# Patient Record
Sex: Female | Born: 2000 | Race: White | Hispanic: No | Marital: Single | Smoking: Never smoker
Health system: Southern US, Community
[De-identification: ages and names within clinical notes are randomized; demographics above are authoritative.]

---
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
## 2021-02-25 NOTE — Progress Notes (Addendum)
Electrophysiology Office Note:    Date:  02/26/2021   ID:  Holly Lambert, DOB Oct 02, 2000, MRN [id number]  PCP:  Patient, No Pcp Per (Inactive)  [facility] Cardiologist:  None  [facility] Electrophysiologist:  Lanier Prude, MD   Referring MD: No ref. provider found   Chief Complaint: Family history of cardiomyopathy  History of Present Illness:    Holly Lambert is a 20 y.o. female who presents for an evaluation of family history of cardiomyopathy as a self-referral.  I see her mother, Holly Lambert.  Her mother has a mutation in the DSP gene which is a known pathogenic gene mutation.  Her mother has a nonischemic cardiomyopathy associated with this gene mutation.   Her only cardiac related complaint is a history of vasovagal sounding near syncope.  She tells me that she can have spells of lightheadedness that usually occur with changes to her vision.  She says the peripheral vision goes dark and she gets tunnel vision and then gets lightheaded.  Sometimes she also developed headache during these episodes.  She is fallen out once before but did not lose consciousness.  No other syncopal episodes.  She does not report any known triggers such as blood draws, etc.     History reviewed. No pertinent past medical history.  History reviewed. No pertinent surgical history.  Current Medications: Current Meds  Medication Sig   albuterol (PROVENTIL HFA;VENTOLIN HFA) 108 (90 BASE) MCG/ACT inhaler Inhale 1 puff into the lungs every 6 (six) hours as needed for wheezing or shortness of breath.   AZSTARYS 39.2-7.8 MG CAPS 1 capsule  once a day   busPIRone (BUSPAR) 15 MG tablet Take by mouth.   escitalopram (LEXAPRO) 10 MG tablet Take 10 mg by mouth daily.   LATUDA 40 MG TABS tablet SMARTSIG:1 Tablet(s) By Mouth Every Evening   methylphenidate (RITALIN) 10 MG tablet Take 10 mg by mouth as needed.     Allergies:   Patient has no known allergies.   Social History    Socioeconomic History   Marital status: Single    Spouse name: Not on file   Number of children: Not on file   Years of education: Not on file   Highest education level: Not on file  Occupational History   Not on file  Tobacco Use   Smoking status: Never   Smokeless tobacco: Never  Substance and Sexual Activity   Alcohol use: Not on file   Drug use: Not on file   Sexual activity: Not on file  Other Topics Concern   Not on file  Social History Narrative   Not on file   Social Determinants of Health   Financial Resource Strain: Not on file  Food Insecurity: Not on file  Transportation Needs: Not on file  Physical Activity: Not on file  Stress: Not on file  Social Connections: Not on file     Family History: The patient's family history is not on file.  ROS:   Please see the history of present illness.    All other systems reviewed and are negative.  EKGs/Labs/Other Studies Reviewed:    The following studies were reviewed today:  Outside hospital echo that showed normal left ventricular function and mild MR.  EKG:  The ekg ordered today demonstrates sinus arrhythmia   Recent Labs: No results found for requested labs within last 8760 hours.  Recent Lipid Panel No results found for: CHOL, TRIG, HDL, CHOLHDL, VLDL, LDLCALC, LDLDIRECT  Physical Exam:  VS:  BP 126/78 (BP Location: Left Arm, Patient Position: Sitting, Cuff Size: Normal)   Pulse 65   Ht 5' 7.5" (1.715 m)   Wt 168 lb (76.2 kg)   SpO2 98%   BMI 25.92 kg/m     Wt Readings from Last 3 Encounters:  02/26/21 168 lb (76.2 kg)  05/25/17 164 lb (74.4 kg) (92 %, Z= 1.40)*  01/05/17 164 lb 4 oz (74.5 kg) (92 %, Z= 1.43)*   * Growth percentiles are based on CDC (Girls, 2-20 Years) data.     GEN:  Well nourished, well developed in no acute distress HEENT: Normal NECK: No JVD; No carotid bruits LYMPHATICS: No lymphadenopathy CARDIAC: RRR, no murmurs, rubs, gallops RESPIRATORY:  Clear to  auscultation without rales, wheezing or rhonchi  ABDOMEN: Soft, non-tender, non-distended MUSCULOSKELETAL:  No edema; No deformity  SKIN: Warm and dry NEUROLOGIC:  Alert and oriented x 3 PSYCHIATRIC:  Normal affect       ASSESSMENT:    1. Family history of cardiomyopathy   2. Vasovagal syncope    PLAN:    In order of problems listed above:  #Family history of cardiomyopathy Her mother has a DSP gene mutation and a nonischemic cardiomyopathy.  Holly Lambert does not have any personal history of symptoms consistent with cardiomyopathy but I do think it is very important to establish her with our genetics team to screen for the gene mutation.  She is already had an echo.  I will base future surveillance echoes on the results of her gene testing.  If she is a carrier, would favor every year or every other year echoes.  We will get the results of her Holter monitor from her outside cardiologist for review.  #Vasovagal syncope Patient reports a history very consistent with vasovagal near syncope.  I do not think there is an arrhythmic cause.  I have encouraged her to stay hydrated and to take the warning signs seriously to avoid personal injury.  Follow-up based on results of genetic testing.     Medication Adjustments/Labs and Tests Ordered: Current medicines are reviewed at length with the patient today.  Concerns regarding medicines are outlined above.  No orders of the defined types were placed in this encounter.  No orders of the defined types were placed in this encounter.    Signed, Rossie Muskrat. Lalla Brothers, MD, [facility], [facility] 02/26/2021 9:32 AM    Electrophysiology [facility]   ----------------------------------------------------------------------------  ADDENDUM 04/02/2021  Received ZIO XT results for Holly Lambert.  I have personally reviewed the results.  Heart rate 34-1 80, average 81 bpm.  Rare supraventricular and ventricular ectopy.  No sustained  arrhythmias.  Normal heart rate variation.  Patient activated events corresponded to sinus rhythm.  Holly Lang T. Lalla Brothers, MD, [facility], [facility] Cardiac Electrophysiology

## 2021-02-26 ENCOUNTER — Ambulatory Visit: Payer: Managed Care, Other (non HMO) | Admitting: Cardiology

## 2021-02-26 ENCOUNTER — Other Ambulatory Visit: Payer: Self-pay

## 2021-02-26 ENCOUNTER — Encounter: Payer: Self-pay | Admitting: Cardiology

## 2021-02-26 VITALS — BP 126/78 | HR 65 | Ht 67.5 in | Wt 168.0 lb

## 2021-02-26 DIAGNOSIS — R55 Syncope and collapse: Secondary | ICD-10-CM

## 2021-02-26 DIAGNOSIS — Z8249 Family history of ischemic heart disease and other diseases of the circulatory system: Secondary | ICD-10-CM | POA: Diagnosis not present

## 2021-02-26 NOTE — Patient Instructions (Signed)
Medication Instructions:  Your physician recommends that you continue on your current medications as directed. Please refer to the Current Medication list given to you today. *If you need a refill on your cardiac medications before your next appointment, please call your pharmacy*  Lab Work: None ordered. If you have labs (blood work) drawn today and your tests are completely normal, you will receive your results only by: MyChart Message (if you have MyChart) OR A paper copy in the mail If you have any lab test that is abnormal or we need to change your treatment, we will call you to review the results.  Testing/Procedures: None ordered.  Follow-Up: At [facility], you and your health needs are our priority.  As part of our continuing mission to provide you with exceptional heart care, we have created designated Provider Care Teams.  These Care Teams include your primary Cardiologist (physician) and Advanced Practice Providers (APPs -  Physician Assistants and Nurse Practitioners) who all work together to provide you with the care you need, when you need it.  Your next appointment:   Your physician wants you to follow-up based on outcome of testing with Dr. Jomarie Longs.

## 2021-02-27 ENCOUNTER — Telehealth: Payer: Self-pay

## 2021-02-27 NOTE — Telephone Encounter (Signed)
Attempted to call Pt.   No answer and VM was not set up.  Need to determine what doctor ordered the heart monitor Pt completed recently and request those records.  Will try again.

## 2021-03-11 NOTE — Addendum Note (Signed)
Addended by: Theola Sequin on: 03/11/2021 01:18 PM   Modules accepted: Orders

## 2021-04-01 NOTE — Telephone Encounter (Signed)
Fax sent to Dr. Collier Flowers office requesting HM results be faxed to our office.

## 2021-04-02 ENCOUNTER — Telehealth: Payer: Self-pay

## 2021-04-02 NOTE — Telephone Encounter (Signed)
HM results received and given to Dr. Lalla Brothers.

## 2021-07-22 ENCOUNTER — Encounter: Payer: Managed Care, Other (non HMO) | Admitting: Genetic Counselor

## 2021-10-14 ENCOUNTER — Ambulatory Visit: Payer: Managed Care, Other (non HMO) | Admitting: Genetic Counselor

## 2021-10-17 NOTE — Progress Notes (Signed)
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  She is asymptomatic and had normal cardiac imaging studies. She would like to undergo genetic testing for the familial DSP variant.  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 I explained to her that mutations in desmosomal proteins are implicated in arrhythmogenic right ventricular cardiomyopathy. Explained to her that DSP mutations have an autosomal dominant inheritance pattern and therefore, she is at a 50% risk of inheriting this mutation. Informed her that if she tests positive for the familial DSP variant, she will undergo cardiac screening at regular intervals to assess her cardiac function. She verbalized understanding of this.   I reviewed the Genetic Information Non-Discrimination Act (GINA). I explained to her that GINA protects her from losing her employment or health insurance based on her genotype. However, these protections do not cover life insurance and disability. She tells me that she has life insurance.  Please note that the patient has not been counseled in this visit on personal, cultural or ethical issues that she may face due to her heart condition.   Plan After a thorough discussion of the risk and benefits of genetic testing for familial DSP pathogenic variant, Holly Lambert states her intent to pursue genetic testing and signed the informed consent form. Blood was drawn today for testing.    Sidney Ace, Ph.D, [facility] Clinical Molecular  Geneticist

## 2021-10-20 ENCOUNTER — Other Ambulatory Visit: Payer: Self-pay | Admitting: *Deleted

## 2021-10-20 DIAGNOSIS — Z8249 Family history of ischemic heart disease and other diseases of the circulatory system: Secondary | ICD-10-CM

## 2021-10-20 NOTE — Progress Notes (Signed)
Dear Dr. Lalla Brothers,   Can you please put in the genetic test order for the DSP familial variant that was previously reported in her mother, [name] so her test can be initiated.   I saw her this week and her notes have been forwarded to you.   Thanks and take care  [place]

## 2022-04-21 ENCOUNTER — Encounter: Payer: Managed Care, Other (non HMO) | Admitting: Genetic Counselor

## 2022-04-28 ENCOUNTER — Ambulatory Visit: Payer: Managed Care, Other (non HMO) | Admitting: Genetic Counselor

## 2022-05-05 NOTE — Progress Notes (Signed)
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 Her identity was confirmed using two unique identifiers.  I informed her that she does not harbor the familial DSP variant that was previously detected in her mother, [name] (DOB: 02/26/1967). I explained to her that this indicates that she is not at risk of having a cardiomyopathy due to the familial mutation. She tells me that she has been having near fainting spells in the past and was worried that she may have the mutation. She was relieved to hear that she is negative for the familial variant.  Sidney Ace, Ph.D, [facility] Clinical Molecular Geneticist
